# Patient Record
Sex: Male | Born: 1990 | Race: Black or African American | Hispanic: No | Marital: Single | Smoking: Current every day smoker
Health system: Southern US, Community
[De-identification: ages and names within clinical notes are randomized; demographics above are authoritative.]

## PROBLEM LIST (undated history)

## (undated) HISTORY — PX: THUMB ARTHROSCOPY: SHX2509

---
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
## 2011-01-25 LAB — DIFFERENTIAL
Basophils Absolute: 0 10*3/uL (ref 0.0–0.1)
Basophils Relative: 0 % (ref 0–1)
Eosinophils Absolute: 0 10*3/uL (ref 0.0–0.7)
Lymphocytes Relative: 30 % (ref 12–46)
Monocytes Relative: 6 % (ref 3–12)

## 2011-01-25 LAB — COMPREHENSIVE METABOLIC PANEL
ALT: 25 U/L (ref 0–53)
AST: 55 U/L — ABNORMAL HIGH (ref 0–37)
Calcium: 8.9 mg/dL (ref 8.4–10.5)
Chloride: 103 mEq/L (ref 96–112)
Creatinine, Ser: 1.02 mg/dL (ref 0.4–1.5)
GFR calc Af Amer: >60 mL/min (ref >60)
Potassium: 3.8 mEq/L (ref 3.5–5.1)
Sodium: 135 mEq/L (ref 135–145)

## 2011-01-25 LAB — LIPASE, BLOOD: Lipase: 33 U/L (ref 11–59)

## 2011-01-25 LAB — CBC
MCH: 29.3 pg (ref 26.0–34.0)
MCV: 82.7 fL (ref 78.0–100.0)
WBC: 10.7 10*3/uL — ABNORMAL HIGH (ref 4.0–10.5)

## 2011-02-12 ENCOUNTER — Ambulatory Visit (HOSPITAL_COMMUNITY): Payer: Self-pay

## 2011-02-16 LAB — MONONUCLEOSIS SCREEN: Mono Screen: NEGATIVE

## 2014-05-02 ENCOUNTER — Encounter (HOSPITAL_COMMUNITY): Payer: Self-pay | Admitting: Emergency Medicine

## 2014-05-02 ENCOUNTER — Emergency Department (HOSPITAL_COMMUNITY): Payer: Self-pay

## 2014-05-02 ENCOUNTER — Emergency Department (HOSPITAL_COMMUNITY)
Admission: EM | Admit: 2014-05-02 | Discharge: 2014-05-02 | Disposition: A | Discharge status: Home / Self Care | Payer: Self-pay | Attending: Emergency Medicine | Admitting: Emergency Medicine

## 2014-05-02 DIAGNOSIS — L02519 Cutaneous abscess of unspecified hand: Secondary | ICD-10-CM | POA: Insufficient documentation

## 2014-05-02 DIAGNOSIS — L03019 Cellulitis of unspecified finger: Principal | ICD-10-CM

## 2014-05-02 DIAGNOSIS — L02511 Cutaneous abscess of right hand: Secondary | ICD-10-CM

## 2014-05-02 MED ORDER — SULFAMETHOXAZOLE-TRIMETHOPRIM 800-160 MG PO TABS: 1.0000 | ORAL_TABLET | Freq: Two times a day (BID) | ORAL | Status: DC

## 2014-05-02 MED ORDER — OXYCODONE-ACETAMINOPHEN 5-325 MG PO TABS: 1.0000 | ORAL_TABLET | Freq: Four times a day (QID) | ORAL | Status: DC | PRN

## 2014-05-02 MED ORDER — HYDROCODONE-ACETAMINOPHEN 5-325 MG PO TABS
2.0000 | ORAL_TABLET | Freq: Once | ORAL | Status: AC
  Administered 2014-05-02: 2 via ORAL
  Filled 2014-05-02: qty 2

## 2014-05-02 MED ORDER — ONDANSETRON 4 MG PO TBDP
8.0000 mg | ORAL_TABLET | Freq: Once | ORAL | Status: AC
  Administered 2014-05-02: 8 mg via ORAL
  Filled 2014-05-02: qty 2

## 2014-05-02 MED ORDER — CEPHALEXIN 500 MG PO CAPS: ORAL_CAPSULE | ORAL | Status: DC

## 2014-05-02 NOTE — ED Notes (Signed)
Declined W/C at D/C and was escorted to lobby by RN. 

## 2014-05-02 NOTE — ED Provider Notes (Signed)
CSN: [id number]     Arrival date & time 05/02/14  2002 History  This chart was scribed for non-physician practitioner, Junious SilkHannah Merrell PA-C, working with Glynn OctaveStephen Rancour, MD by Milly JakobJohn Lee Graves, ED Scribe. The patient was seen in room TR10C/TR10C. Patient's care was started at 8:28 PM.     Chief Complaint  Patient presents with  . Hand Pain   The history is provided by the patient. No language interpreter was used.   HPI Comments: Matthew [name] is a 23 y.o. male who presents to the Emergency Department complaining of constant, severe, throbbing pain in his right thumb onset 7 days ago. He states that he has been experiencing pain for a few months, and reports biting a callus on this thumb. He states that the pain is exacerbated by bending his thumb.  He reports that he noticed drainage from his thumb after hitting it against his car door. He denies any additional medical problems.   No past medical history on file. No past surgical history on file. No family history on file. History  Substance Use Topics  . Smoking status: Not on file  . Smokeless tobacco: Not on file  . Alcohol Use: Not on file    Review of Systems  Skin: Positive for color change (right thumb).  All other systems reviewed and are negative.     Allergies  Review of patient's allergies indicates not on file.  Home Medications   Prior to Admission medications   Not on File   Triage Vitals: BP 134/62  Pulse 73  Temp(Src) 97 F (36.1 C) (Oral)  Resp 16  SpO2 100% Physical Exam  Nursing note and vitals reviewed. Constitutional: He is oriented to person, place, and time. He appears well-developed and well-nourished. No distress.  HENT:  Head: Normocephalic and atraumatic.  Right Ear: External ear normal.  Left Ear: External ear normal.  Nose: Nose normal.  Eyes: Conjunctivae are normal.  Neck: Normal range of motion. No tracheal deviation present.  Cardiovascular: Normal rate, regular rhythm, normal  heart sounds, intact distal pulses and normal pulses.   Pulses:      Radial pulses are 2+ on the right side.  Capillary refill < 3 seconds in right thumb  Pulmonary/Chest: Effort normal and breath sounds normal. No stridor.  Abdominal: Soft. He exhibits no distension. There is no tenderness.  Musculoskeletal: Normal range of motion.  Patient is able to flex and extend right thumb. ROM mildly limited due to swelling, not pain.  Compartment soft, neurovascularly intact.   Neurological: He is alert and oriented to person, place, and time.  Skin: Skin is warm and dry. He is not diaphoretic.  Swelling to right thumb. 2 cm fluctuant area to palmar aspect of right thumb. No active drainage. No surrounding erythema or streaking.   Psychiatric: He has a normal mood and affect. His behavior is normal.    ED Course  Procedures (including critical care time) DIAGNOSTIC STUDIES: Oxygen Saturation is 100% on room air, normal by my interpretation.    COORDINATION OF CARE: 8:32 PM-Discussed treatment plan which includes thumb x-ray with pt at bedside and pt agreed to plan.   Labs Review Labs Reviewed - No data to display  Imaging Review Dg Finger Thumb Right  05/02/2014   CLINICAL DATA:  HAND PAIN  EXAM: RIGHT THUMB 2+V  COMPARISON:  None.  FINDINGS: There is no evidence of fracture or dislocation. There is no evidence of arthropathy or other focal bone abnormality. Mild soft  tissue swelling seen at the volar aspect of the right first proximal phalanx. No retained foreign body or soft tissue emphysema.  IMPRESSION: 1. No acute fracture or dislocation. 2. Focal soft tissue swelling at the volar aspect of the right first proximal phalanx. No radiopaque foreign body or soft tissue emphysema.   Electronically Signed   By: Rise MuBenjamin  McClintock M.D.   On: 05/02/2014 21:16     EKG Interpretation None      INCISION AND DRAINAGE Performed by: Junious SilkMerrell, Hannah Consent: Verbal consent obtained. Risks and  benefits: risks, benefits and alternatives were discussed Type: abscess  Body area: right thumb  Anesthesia: digital block  Incision was made with a scalpel.  Local anesthetic: lidocaine 2%   Anesthetic total: 3 ml  Complexity: complex Blunt dissection to break up loculations  Drainage: purulent  Drainage amount: copious  Packing material: 1/4 in iodoform gauze  Patient tolerance: Patient tolerated the procedure well with no immediate complications.     MDM   Final diagnoses:  Abscess of finger, right   9:30 PM Discussed with Dr. Mina MarbleWeingold given location of abscess. Will I&D here, pack abscess and start on either Bactrim or doxycyline. Patient can follow up with Dr. Mina MarbleWeingold in the office tomorrow or Tuesday.   Patient with skin abscess amenable to incision and drainage.  Given location of abscess, discussed case with Dr. Mina MarbleWeingold who recommends I&D, packing, wound culture and abx. This was done without complication. Encouraged home warm compresses. Patient will follow up with Dr. Mina MarbleWeingold in the office.  Will d/c to home.    I personally performed the services described in this documentation, which was scribed in my presence. The recorded information has been reviewed and is accurate.    Mora BellmanHannah S Merrell, PA-C 05/03/14 1255

## 2014-05-02 NOTE — ED Notes (Signed)
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 Pt reports he bites the callus and now the thumb is swollen and painful. Pt reports pain to be 10/10. The skin is dry and no drainage is present.

## 2014-05-02 NOTE — Discharge Instructions (Signed)

## 2014-05-03 NOTE — ED Provider Notes (Signed)
Medical screening examination/treatment/procedure(s) were performed by non-physician practitioner and as supervising physician I was immediately available for consultation/collaboration.   EKG Interpretation None       Stephen Rancour, MD 05/03/14 1527 

## 2014-05-05 LAB — WOUND CULTURE

## 2015-01-31 IMAGING — CR DG FINGER THUMB 2+V*R*
3 series · 3 of 3 positions shown · non-contrast
Comparison: None.

CLINICAL DATA: HAND PAIN

EXAM:
RIGHT THUMB 2+V

[x finger pa right]
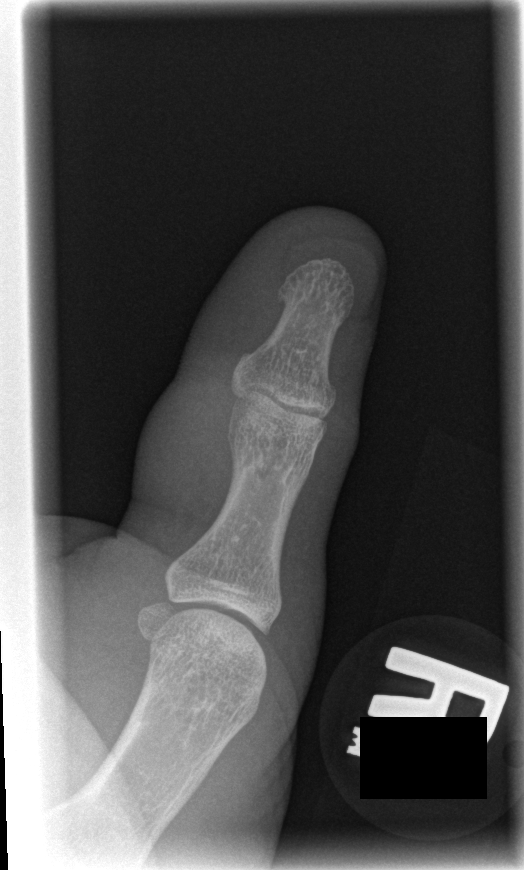

[x finger obl. right]
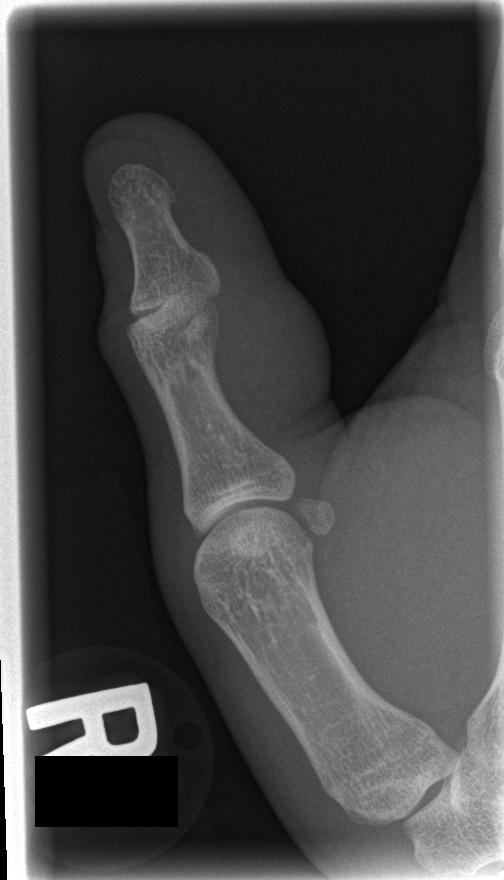

[x finger lateral right]
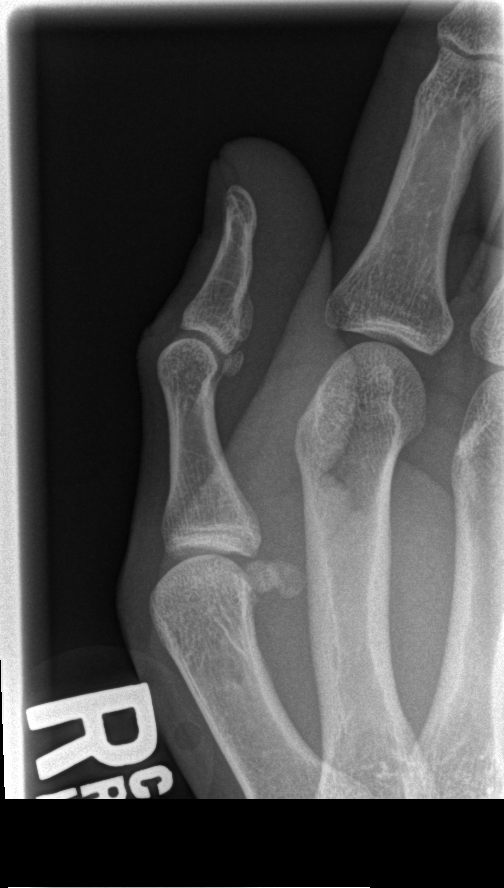

[3 of 3 positions shown; findings below may reference images not displayed]

FINDINGS: There is no evidence of fracture or dislocation. There is no
evidence of arthropathy or other focal bone abnormality. Mild soft
tissue swelling seen at the volar aspect of the right first proximal
phalanx. No retained foreign body or soft tissue emphysema.
IMPRESSION: 1. No acute fracture or dislocation.
2. Focal soft tissue swelling at the volar aspect of the right first
proximal phalanx. No radiopaque foreign body or soft tissue
emphysema.

## 2016-08-05 ENCOUNTER — Emergency Department (HOSPITAL_COMMUNITY)
Admission: EM | Admit: 2016-08-05 | Discharge: 2016-08-05 | Disposition: A | Discharge status: Home / Self Care | Payer: Self-pay | Attending: Emergency Medicine | Admitting: Emergency Medicine

## 2016-08-05 ENCOUNTER — Emergency Department (HOSPITAL_COMMUNITY): Payer: Self-pay

## 2016-08-05 ENCOUNTER — Encounter (HOSPITAL_COMMUNITY): Payer: Self-pay | Admitting: Emergency Medicine

## 2016-08-05 DIAGNOSIS — Y929 Unspecified place or not applicable: Secondary | ICD-10-CM | POA: Insufficient documentation

## 2016-08-05 DIAGNOSIS — W208XXA Other cause of strike by thrown, projected or falling object, initial encounter: Secondary | ICD-10-CM | POA: Insufficient documentation

## 2016-08-05 DIAGNOSIS — Y9389 Activity, other specified: Secondary | ICD-10-CM | POA: Insufficient documentation

## 2016-08-05 DIAGNOSIS — Z79899 Other long term (current) drug therapy: Secondary | ICD-10-CM | POA: Insufficient documentation

## 2016-08-05 DIAGNOSIS — Y999 Unspecified external cause status: Secondary | ICD-10-CM | POA: Insufficient documentation

## 2016-08-05 DIAGNOSIS — M25561 Pain in right knee: Secondary | ICD-10-CM | POA: Insufficient documentation

## 2016-08-05 DIAGNOSIS — F17290 Nicotine dependence, other tobacco product, uncomplicated: Secondary | ICD-10-CM | POA: Insufficient documentation

## 2016-08-05 NOTE — ED Provider Notes (Signed)
[facility]-EMERGENCY DEPT Provider Note   CSN: [id number] Arrival date & time: 08/05/16  1048  By signing my name below, I, [name], attest that this documentation has been prepared under the direction and in the presence of Teressa Lower NP.  Electronically Signed: [name], ED Scribe. 08/05/16. 12:31 PM.   History   Chief Complaint Chief Complaint  Patient presents with  . Knee Injury    HPI HPI Comments: Matthew [name] is a 25 y.o. male who presents to the Emergency Department complaining of right knee pain s/p an injury that occurred yesterday. Pt was sitting down and dropped his drum on his knee. He reports worst pain to medial aspect of the right knee. No Hx of knee injuries or surgeries. Pt is able to ambulate and is wearing a right knee brace currently to help with stability during ambulation.    The history is provided by the patient. No language interpreter was used.    History reviewed. No pertinent past medical history.  There are no active problems to display for this patient.   History reviewed. No pertinent surgical history.     Home Medications    Prior to Admission medications   Medication Sig Start Date End Date Taking? Authorizing Provider  cephALEXin (KEFLEX) 500 MG capsule 2 caps po bid x 7 days 05/02/14   Junious Silk, PA-C  naphazoline-glycerin (CLEAR EYES) 0.012-0.2 % SOLN Place 1-2 drops into both eyes every 4 (four) hours as needed for irritation.    Historical Provider, MD  oxyCODONE-acetaminophen (PERCOCET/ROXICET) 5-325 MG per tablet Take 1 tablet by mouth every 6 (six) hours as needed for severe pain. May take 2 tablets PO q 6 hours for severe pain - Do not take with Tylenol as this tablet already contains tylenol 05/02/14   Junious Silk, PA-C  sulfamethoxazole-trimethoprim (SEPTRA DS) 800-160 MG per tablet Take 1 tablet by mouth every 12 (twelve) hours. 05/02/14   Junious Silk, PA-C    Family History No family history on  file.  Social History Social History  Substance Use Topics  . Smoking status: Current Every Day Smoker    Types: Cigars  . Smokeless tobacco: Never Used  . Alcohol use No     Allergies   Review of patient's allergies indicates no known allergies.   Review of Systems Review of Systems  Musculoskeletal: Positive for arthralgias (right knee).  Neurological: Negative for numbness.  All other systems reviewed and are negative.    Physical Exam Updated Vital Signs BP 153/85   Pulse 73   Temp 98 F (36.7 C)   SpO2 100%   Physical Exam  Constitutional: He is oriented to person, place, and time. He appears well-developed and well-nourished. No distress.  HENT:  Head: Normocephalic and atraumatic.  Neck: Normal range of motion.  Cardiovascular: Normal rate.   Pulmonary/Chest: Effort normal and breath sounds normal.  Musculoskeletal: Normal range of motion.  No deformity or swelling noted. Pulses intact. Full rom  Neurological: He is alert and oriented to person, place, and time.  Skin: Skin is warm and dry. Capillary refill takes less than 2 seconds. He is not diaphoretic.  Psychiatric: He has a normal mood and affect. Judgment normal.  Nursing note and vitals reviewed.    ED Treatments / Results  DIAGNOSTIC STUDIES: Oxygen Saturation is 100% on RA, normal by my interpretation.  COORDINATION OF CARE: 12:30 PM-Will wait for imaging results. Discussed treatment plan with pt at bedside and pt agreed to plan.  Labs (all labs ordered are listed, but only abnormal results are displayed) Labs Reviewed - No data to display  EKG  EKG Interpretation None       Radiology Dg Knee Complete 4 Views Right  Result Date: 08/05/2016 CLINICAL DATA:  Right knee pain after injury yesterday. EXAM: RIGHT KNEE - COMPLETE 4+ VIEW COMPARISON:  None. FINDINGS: No evidence of fracture, dislocation, or joint effusion. No evidence of arthropathy or other focal bone abnormality. Soft  tissues are unremarkable. IMPRESSION: Normal right knee. Electronically Signed   By: Lupita RaiderJames  Green Jr, M.D.   On: 08/05/2016 12:28    Procedures Procedures (including critical care time)  Medications Ordered in ED Medications - No data to display   Initial Impression / Assessment and Plan / ED Course  I have reviewed the triage vital signs and the nursing notes.  Pertinent labs & imaging results that were available during my care of the patient were reviewed by me and considered in my medical decision making (see chart for details).  Clinical Course    No acute bony injury. Discussed supportive care with pt  Final Clinical Impressions(s) / ED Diagnoses   Final diagnoses:  None    New Prescriptions New Prescriptions   No medications on file  I personally performed the services described in this documentation, which was scribed in my presence. The recorded information has been reviewed and is accurate.     Teressa LowerVrinda Kashon Kraynak, NP 08/05/16 1248    Mancel BaleElliott Wentz, MD 08/05/16 [phone]

## 2016-08-05 NOTE — ED Notes (Signed)
Bed: WTR9 Expected date:  Expected time:  Means of arrival:  Comments: 

## 2016-08-05 NOTE — ED Triage Notes (Signed)
Patient states that yesterday he was packing up his drums and dropped one on his right knee and having pain. Patient is wearing a brace on right knee to help with stability.

## 2017-09-09 ENCOUNTER — Ambulatory Visit: Payer: Self-pay | Admitting: Podiatry

## 2017-09-24 ENCOUNTER — Ambulatory Visit: Payer: Self-pay | Admitting: Podiatry

## 2017-11-17 ENCOUNTER — Encounter: Payer: Self-pay | Admitting: Podiatry

## 2017-11-24 NOTE — Progress Notes (Signed)
This encounter was created in error - please disregard.

## 2017-11-26 ENCOUNTER — Encounter: Payer: Self-pay | Admitting: Family Medicine

## 2017-11-26 ENCOUNTER — Ambulatory Visit (INDEPENDENT_AMBULATORY_CARE_PROVIDER_SITE_OTHER): Payer: No Typology Code available for payment source | Admitting: Family Medicine

## 2017-11-26 VITALS — BP 124/82 | HR 86 | Temp 99.2°F | Resp 18 | Ht 70.0 in | Wt 190.0 lb

## 2017-11-26 DIAGNOSIS — B353 Tinea pedis: Secondary | ICD-10-CM

## 2017-11-26 DIAGNOSIS — R61 Generalized hyperhidrosis: Secondary | ICD-10-CM

## 2017-11-26 DIAGNOSIS — Z23 Encounter for immunization: Secondary | ICD-10-CM | POA: Diagnosis not present

## 2017-11-26 DIAGNOSIS — B360 Pityriasis versicolor: Secondary | ICD-10-CM | POA: Diagnosis not present

## 2017-11-26 DIAGNOSIS — B351 Tinea unguium: Secondary | ICD-10-CM

## 2017-11-26 MED ORDER — KETOCONAZOLE 2 % EX SHAM: 1.0000 "application " | MEDICATED_SHAMPOO | CUTANEOUS | 0 refills | Status: DC

## 2017-11-26 NOTE — Patient Instructions (Addendum)
IF you received an x-ray today, you will receive an invoice from [facility]. Please contact [facility] at [phone] with questions or concerns regarding your invoice.   IF you received labwork today, you will receive an invoice from [facility]. Please contact [facility] at [phone] with questions or concerns regarding your invoice.   Our billing staff will not be able to assist you with questions regarding bills from these companies.  You will be contacted with the lab results as soon as they are available. The fastest way to get your results is to activate your My Chart account. Instructions are located on the last page of this paperwork. If you have not heard from [place] regarding the results in 2 weeks, please contact this office.    Hyperhidrosis It is normal to sweat when you are hot, being physically active, or feeling anxious. Sweating is a necessary function for your body. However, hyperhidrosis is when you sweat too much (excessively). Although hyperhidrosis is not dangerous, it can make you feel embarrassed. There are two kinds of hyperhidrosis:  Primary hyperhidrosis. The sweating usually localizes in one part of your body, such as your underarms, or in a few areas, such as your feet, face, armpits, and hands. This is the more common kind of hyperhidrosis.  Secondary hyperhidrosis. This type more likely affects your entire body.  What are the causes? The cause of your hyperhidrosis depends on the kind you have.  Primary hyperhidrosis may be caused by having sweat glands that are more active than normal.  Secondary hyperhidrosis is caused by an underlying condition. Possible conditions include: ? Diabetes. ? Gout. ? Certain medicines. ? Anxiety. ? Stroke. ? Obesity. ? Menopause. ? Overactive thyroid (hyperthyroidism). ? Tumors. ? Frostbite. ? Certain types of cancers. ? Alcoholism. ? Injury to your nervous system. ? Stroke. ? Parkinson  disease.  What increases the risk? You may be at an increased risk for primary hyperhidrosis if you have a family history of it. What are the signs or symptoms? General symptoms of hyperhidrosis may include:  Feeling like you are sweating constantly, even while you are resting.  Having skin that peels or gets paler or softer in the areas where you sweat the most.  Being able to see sweat on your skin.  Symptoms of primary hyperhidrosis may include:  Sweating in specific areas, such as your armpits, palms, feet, and face.  Sweating in the same location on both sides of your body.  Sweating only during the day.  Symptoms of secondary hyperhidrosis may include:  Sweating all over your body.  Sweating even while you sleep.  How is this diagnosed? Hyperhidrosis may be diagnosed by:  Medical history and physical exam.  Testing, such as: ? Sweat test. ? Paper test.  How is this treated? Your treatment will depend on the kind of hyperhidrosis you have and the parts of your body that are affected. If your hyperhidrosis is caused by an underlying condition, your treatment will address the cause. Treatment may include:  Strong antiperspirants. Your health care provider may give you a prescription.  Medicines taken by mouth.  Medicines injected by your health care provider. These may include small amounts of botulinum toxin.  Iontophoresis. This is a procedure that temporarily turns off the sweat glands in your hands and feet.  Surgery to remove your sweat glands.  Sympathectomy. This is a procedure that cuts or destroys your nerves so that they do not send a signal to sweat.  Follow these instructions at home:  Take medicines only as directed by your health care provider.  Use antiperspirants as directed by your health care provider.  Limit or avoid foods or beverages that seem to increase your chances of sweating, such as: ? Spicy  food. ? Caffeine. ? Alcohol. ? Foods that contain MSG.  If your feet sweat: ? Wear sandals, when possible. ? Do not wear cotton socks. Wear socks that remove or wick moisture from your feet. ? Wear leather shoes. ? Avoid wearing the same pair of shoes two days in a row.  Consider joining a hyperhidrosis support group. Contact a health care provider if:  You have new symptoms.  Your symptoms get worse. This information is not intended to replace advice given to you by your health care provider. Make sure you discuss any questions you have with your health care provider. Document Released: 12/27/2005 Document Revised: 04/04/2016 Document Reviewed: 06/07/2014 Elsevier Interactive Patient Education  2018 [employer] Tinea Versicolor Tinea versicolor is a common fungal infection of the skin. It causes a rash that appears as light or dark patches on the skin. The rash most often occurs on the chest, back, neck, or upper arms. This condition is more common during warm weather. Other than affecting how your skin looks, tinea versicolor usually does not cause other problems. In most cases, the infection goes away in a few weeks with treatment. It may take a few months for the patches on your skin to clear up. What are the causes? Tinea versicolor occurs when a type of fungus that is normally present on the skin starts to overgrow. This fungus is a kind of yeast. The exact cause of the overgrowth is not known. This condition cannot be passed from one person to another (noncontagious). What increases the risk? This condition is more likely to develop when certain factors are present, such as:  Heat and humidity.  Sweating too much.  Hormone changes.  Oily skin.  A weak defense (immune) system.  What are the signs or symptoms? Symptoms of this condition may include:  A rash on your skin that is made up of light or dark patches. The rash may have: ? Patches of tan or pink spots on  light skin. ? Patches of white or brown spots on dark skin. ? Patches of skin that do not tan. ? Well-marked edges. ? Scales on the discolored areas.  Mild itching.  How is this diagnosed? A health care provider can usually diagnose this condition by looking at your skin. During the exam, he or she may use ultraviolet light to help determine the extent of the infection. In some cases, a skin sample may be taken by scraping the rash. This sample will be viewed under a microscope to check for yeast overgrowth. How is this treated? Treatment for this condition may include:  Dandruff shampoo that is applied to the affected skin during showers or bathing.  Over-the-counter medicated skin cream, lotion, or soaps.  Prescription antifungal medicine in the form of skin cream or pills.  Medicine to help reduce itching.  Follow these instructions at home:  Take medicines only as directed by your health care provider.  Apply dandruff shampoo to the affected area if told to do so by your health care provider. You may be instructed to scrub the affected skin for several minutes each day.  Do not scratch the affected area of skin.  Avoid hot and humid conditions.  Do not use tanning  booths.  Try to avoid sweating a lot. Contact a health care provider if:  Your symptoms get worse.  You have a fever.  You have redness, swelling, or pain at the site of your rash.  You have fluid, blood, or pus coming from your rash.  Your rash returns after treatment. This information is not intended to replace advice given to you by your health care provider. Make sure you discuss any questions you have with your health care provider. Document Released: 10/25/2000 Document Revised: 06/30/2016 Document Reviewed: 08/09/2014 Elsevier Interactive Patient Education  2018 [employer].

## 2017-11-26 NOTE — Progress Notes (Signed)
Chief Complaint  Patient presents with  . Establish Care    Skin discoloration, Patient believes from drinking soda. Burning sensation on feet    HPI   Pt reports that he has been having burning sensation of the lateral aspect of the feet bilaterally He reports that he has to wear crocs He reports that he sweats on his feet  Noticed a rash on his forehead and torso Sometimes itchy Worse in the winter than summer No fevers or chills     No past medical history on file.  Current Outpatient Medications  Medication Sig Dispense Refill  . [START ON 11/27/2017] ketoconazole (NIZORAL) 2 % shampoo Apply 1 application topically 2 (two) times a week. 120 mL 0  . oxyCODONE-acetaminophen (PERCOCET/ROXICET) 5-325 MG per tablet Take 1 tablet by mouth every 6 (six) hours as needed for severe pain. May take 2 tablets PO q 6 hours for severe pain - Do not take with Tylenol as this tablet already contains tylenol (Patient not taking: Reported on 11/26/2017) 3 tablet 0   No current facility-administered medications for this visit.     Allergies: No Known Allergies  No past surgical history on file.  Social History   Socioeconomic History  . Marital status: Single    Spouse name: None  . Number of children: None  . Years of education: None  . Highest education level: None  Social Needs  . Financial resource strain: None  . Food insecurity - worry: None  . Food insecurity - inability: None  . Transportation needs - medical: None  . Transportation needs - non-medical: None  Occupational History  . None  Tobacco Use  . Smoking status: Current Every Day Smoker    Types: Cigars  . Smokeless tobacco: Never Used  Substance and Sexual Activity  . Alcohol use: No  . Drug use: None  . Sexual activity: None  Other Topics Concern  . None  Social History Narrative  . None    No family history on file.   ROS Review of Systems See HPI Constitution: No fevers or chills No  malaise No diaphoresis Skin: No rash or itching Eyes: no blurry vision, no double vision GU: no dysuria or hematuria Neuro: no dizziness or headaches all others reviewed and negative   Objective: Vitals:   11/26/17 1325  BP: 124/82  Pulse: 86  Resp: 18  Temp: 99.2 F (37.3 C)  TempSrc: Oral  SpO2: 98%  Weight: 190 lb (86.2 kg)  Height: 5\' 10"  (1.778 m)    Physical Exam  Constitutional: He is oriented to person, place, and time. He appears well-developed and well-nourished.  HENT:  Head: Normocephalic and atraumatic.  Eyes: Conjunctivae and EOM are normal.  Cardiovascular: Normal rate, regular rhythm and normal heart sounds.  No murmur heard. Pulses:      Dorsalis pedis pulses are 2+ on the right side, and 2+ on the left side.       Posterior tibial pulses are 2+ on the right side, and 2+ on the left side.  Pulmonary/Chest: Effort normal and breath sounds normal. No stridor. No respiratory distress.  Feet:  Right Foot:  Protective Sensation: 4 sites tested. 4 sites sensed.  Skin Integrity: Positive for callus. Negative for ulcer or blister.  Left Foot:  Protective Sensation: 4 sites tested. 4 sites sensed.  Skin Integrity: Positive for callus. Negative for ulcer or blister.  Neurological: He is alert and oriented to person, place, and time.  Skin:  Torso and  forehead with tan patch of skin that are well demarcated, non scaly, flat, no purulence, no erythema  Feet: Thickened nails on both feet, grey skin on the heel and in between toes, noted to be the worst between 4th and 5th toes bilaterally     Assessment and Plan Carla DrapeMarkel was seen today for establish care.  Diagnoses and all orders for this visit:  Pityriasis versicolor- advise - advised shampoo and topical treatment -     ketoconazole (NIZORAL) 2 % shampoo; Apply 1 application topically 2 (two) times a week.  Needs flu shot -     Flu Vaccine QUAD 36+ mos IM  Onychomycosis- would recommend lamisil Will  check liver function   -     Ambulatory referral to Podiatry  Tinea pedis of both feet- referral for debridement and treatment and to establish care -     Ambulatory referral to Podiatry  Hyperhidrosis- discussed home care of feet   Other orders -     Tdap vaccine greater than or equal to 7yo IM     Sylvain Hasten A [employer]

## 2017-11-27 LAB — COMPREHENSIVE METABOLIC PANEL
ALT: 14 IU/L (ref 0–44)
AST: 21 IU/L (ref 0–40)
Albumin/Globulin Ratio: 1.8 (ref 1.2–2.2)
Albumin: 5 g/dL (ref 3.5–5.5)
Alkaline Phosphatase: 52 IU/L (ref 39–117)
BUN/Creatinine Ratio: 7 — ABNORMAL LOW (ref 9–20)
BUN: 9 mg/dL (ref 6–20)
Bilirubin Total: 0.2 mg/dL (ref 0.0–1.2)
CALCIUM: 9.7 mg/dL (ref 8.7–10.2)
CHLORIDE: 104 mmol/L (ref 96–106)
CO2: 21 mmol/L (ref 20–29)
Creatinine, Ser: 1.22 mg/dL (ref 0.76–1.27)
GFR, EST AFRICAN AMERICAN: 95 mL/min/{1.73_m2} (ref >59)
GFR, EST NON AFRICAN AMERICAN: 82 mL/min/{1.73_m2} (ref >59)
GLUCOSE: 72 mg/dL (ref 65–99)
Globulin, Total: 2.8 g/dL (ref 1.5–4.5)
Potassium: 4.3 mmol/L (ref 3.5–5.2)
Sodium: 142 mmol/L (ref 134–144)
TOTAL PROTEIN: 7.8 g/dL (ref 6.0–8.5)

## 2017-11-29 ENCOUNTER — Other Ambulatory Visit: Payer: Self-pay | Admitting: Family Medicine

## 2017-11-29 DIAGNOSIS — B351 Tinea unguium: Secondary | ICD-10-CM

## 2017-11-29 DIAGNOSIS — Z5181 Encounter for therapeutic drug level monitoring: Secondary | ICD-10-CM

## 2017-11-29 MED ORDER — TERBINAFINE HCL 250 MG PO TABS: 250.0000 mg | ORAL_TABLET | Freq: Every day | ORAL | 6 refills | Status: DC

## 2017-11-29 NOTE — Progress Notes (Signed)
Normal liver function thus lamisil called in

## 2017-12-01 ENCOUNTER — Telehealth: Payer: Self-pay | Admitting: Family Medicine

## 2017-12-01 DIAGNOSIS — B360 Pityriasis versicolor: Secondary | ICD-10-CM

## 2017-12-01 DIAGNOSIS — B351 Tinea unguium: Secondary | ICD-10-CM

## 2017-12-01 NOTE — Telephone Encounter (Signed)
Medication:  ketoconazole (NIZORAL) 2 % shampoo  terbinafine (LAMISIL) 250 MG tablet  Preferred Pharmacy (with phone number or street name):  [facility] [place] - [address] [place] [address] Phone: [phone] Fax: [phone]  Patient gave the wrong pharmacy when he was in on 11/26/17, [facility]. [facility] a new [facility] prescription to be sent.

## 2017-12-01 NOTE — Telephone Encounter (Signed)
Copied from CRM #[id number]. Topic: Quick Communication - Rx Refill/Question >> Dec 01, 2017  1:18 PM Viviann SpareWhite, Selina wrote: Medication:  ketoconazole (NIZORAL) 2 % shampoo  terbinafine (LAMISIL) 250 MG tablet  Has the patient contacted their pharmacy? Yes.     (Agent: If no, request that the patient contact the pharmacy for the refill.)  Preferred Pharmacy (with phone number or street name):  [facility] [place], [place] - [address] [place] [address] Phone: [phone] Fax: [phone]   Agent: Please be advised that RX refills may take up to 3 business days. We ask that you follow-up with your pharmacy.  Patient gave the wrong pharmacy when he was in on 11/26/17, He would like for the above RXs to [facility] to [facility]

## 2017-12-02 MED ORDER — TERBINAFINE HCL 250 MG PO TABS: 250.0000 mg | ORAL_TABLET | Freq: Every day | ORAL | 6 refills | Status: DC

## 2017-12-02 MED ORDER — KETOCONAZOLE 2 % EX SHAM: 1.0000 "application " | MEDICATED_SHAMPOO | CUTANEOUS | 2 refills | Status: DC

## 2017-12-02 MED FILL — TERBINAFINE HCL 250 MG TAB: 250 | 30 days supply | Qty: 30 | Fill #0

## 2017-12-02 MED FILL — KETOCONAZOLE 2% SHAMPOO: 2 | 20 days supply | Qty: 120 | Fill #0

## 2017-12-02 NOTE — Telephone Encounter (Signed)
Medication sent to [facility]. Please notify the patient.

## 2017-12-03 NOTE — Telephone Encounter (Signed)
LVM advising pt that rx was sent

## 2019-12-13 ENCOUNTER — Ambulatory Visit: Payer: No Typology Code available for payment source | Attending: Internal Medicine

## 2019-12-13 DIAGNOSIS — Z20822 Contact with and (suspected) exposure to covid-19: Secondary | ICD-10-CM

## 2019-12-14 LAB — NOVEL CORONAVIRUS, NAA: SARS-CoV-2, NAA: NOT DETECTED

## 2020-09-15 ENCOUNTER — Encounter (HOSPITAL_COMMUNITY): Payer: Self-pay | Admitting: Emergency Medicine

## 2020-09-15 ENCOUNTER — Ambulatory Visit (HOSPITAL_COMMUNITY)
Admission: EM | Admit: 2020-09-15 | Discharge: 2020-09-15 | Disposition: A | Discharge status: Home / Self Care | Payer: Self-pay | Attending: Family Medicine | Admitting: Family Medicine

## 2020-09-15 ENCOUNTER — Other Ambulatory Visit: Payer: Self-pay

## 2020-09-15 DIAGNOSIS — M25561 Pain in right knee: Secondary | ICD-10-CM

## 2020-09-15 MED ORDER — CYCLOBENZAPRINE HCL 10 MG PO TABS: 10.0000 mg | ORAL_TABLET | Freq: Three times a day (TID) | ORAL | 0 refills | Status: DC | PRN

## 2020-09-15 MED ORDER — NAPROXEN 500 MG PO TABS: 500.0000 mg | ORAL_TABLET | Freq: Two times a day (BID) | ORAL | 0 refills | Status: DC | PRN

## 2020-09-15 NOTE — ED Triage Notes (Signed)
Pt c/ao right knee pain. He states that he had a previous injury in that knee when he played football in high school. He states on Tuesday night he was lifting some boxes and felt a pop. He states the pain kept him from sleeping and it is a throbbing pain.

## 2020-09-16 NOTE — ED Provider Notes (Signed)
[facility]    CSN: [id number] Arrival date & time: 09/15/20  1307      History   Chief Complaint Chief Complaint  Patient presents with  . Knee Pain    HPI Matthew [name] is a 29 y.o. male.   Presenting today with right knee pain for the past few days after lifting some boxes and feeling a pop in the anterior lateral portion of the knee. Has had some swelling, no discoloration, numbness or tingling, leg weakness. States it throbs the worst at night when he's trying to sleep. Has been using knee sleeve, ice, ibuprofen and had a leftover muscle relaxer he took last night. This has been an intermittent issue that flares every now and again since an old football injury from high school.      History reviewed. No pertinent past medical history.  There are no problems to display for this patient.   Past Surgical History:  Procedure Laterality Date  . THUMB ARTHROSCOPY         Home Medications    Prior to Admission medications   Medication Sig Start Date End Date Taking? Authorizing Provider  cyclobenzaprine (FLEXERIL) 10 MG tablet Take 1 tablet (10 mg total) by mouth 3 (three) times daily as needed for muscle spasms. DO NOT DRINK ALCOHOL OR DRIVE WHILE TAKING THIS MEDICATION 09/15/20   Particia Nearing, PA-C  ketoconazole (NIZORAL) 2 % shampoo Apply 1 application topically 2 (two) times a week. 12/04/17   Doristine Bosworth, MD  naproxen (NAPROSYN) 500 MG tablet Take 1 tablet (500 mg total) by mouth 2 (two) times daily as needed. 09/15/20   Particia Nearing, PA-C  terbinafine (LAMISIL) 250 MG tablet Take 1 tablet (250 mg total) by mouth daily. 12/02/17   Doristine Bosworth, MD    Family History Family History  Problem Relation Age of Onset  . Healthy Mother   . Healthy Father     Social History Social History   Tobacco Use  . Smoking status: Current Every Day Smoker    Packs/day: 0.10    Types: Cigars  . Smokeless tobacco: Never Used  Vaping Use    . Vaping Use: Never used  Substance Use Topics  . Alcohol use: Yes    Comment: on weekends   . Drug use: Not on file     Allergies   Patient has no known allergies.   Review of Systems Review of Systems PER HPI   Physical Exam Triage Vital Signs ED Triage Vitals  Enc Vitals Group     BP 09/15/20 1506 (!) 165/119     Pulse Rate 09/15/20 1506 75     Resp 09/15/20 1506 19     Temp 09/15/20 1506 98.3 F (36.8 C)     Temp src --      SpO2 09/15/20 1506 98 %     Weight --      Height --      Head Circumference --      Peak Flow --      Pain Score 09/15/20 1502 8     Pain Loc --      Pain Edu? --      Excl. in GC? --    No data found.  Updated Vital Signs BP (!) 165/119 (BP Location: Left Arm)   Pulse 75   Temp 98.3 F (36.8 C)   Resp 19   SpO2 98%   Visual Acuity Right Eye Distance:   Left  Eye Distance:   Bilateral Distance:    Right Eye Near:   Left Eye Near:    Bilateral Near:     Physical Exam Vitals and nursing note reviewed.  Constitutional:      Appearance: Normal appearance.  HENT:     Head: Atraumatic.  Eyes:     Extraocular Movements: Extraocular movements intact.     Conjunctiva/sclera: Conjunctivae normal.  Cardiovascular:     Rate and Rhythm: Normal rate and regular rhythm.  Pulmonary:     Effort: Pulmonary effort is normal.     Breath sounds: Normal breath sounds.  Musculoskeletal:        General: Swelling (mild localized edema anterior and lateral right knee, mildly ttp) and tenderness present. No deformity. Normal range of motion.     Cervical back: Normal range of motion and neck supple.     Comments: Neg mcmurrays, drawer testing Gait minimally antalgic, able to extend and flex the knee fully actively, passively and against resistance on the right. No discoloration to knee  Skin:    General: Skin is warm and dry.  Neurological:     General: No focal deficit present.     Mental Status: He is oriented to person, place, and  time.  Psychiatric:        Mood and Affect: Mood normal.        Thought Content: Thought content normal.        Judgment: Judgment normal.      UC Treatments / Results  Labs (all labs ordered are listed, but only abnormal results are displayed) Labs Reviewed - No data to display  EKG   Radiology No results found.  Procedures Procedures (including critical care time)  Medications Ordered in UC Medications - No data to display  Initial Impression / Assessment and Plan / UC Course  I have reviewed the triage vital signs and the nursing notes.  Pertinent labs & imaging results that were available during my care of the patient were reviewed by me and considered in my medical decision making (see chart for details).     Suspect soft tissue strain, no evidence of bony injury today so he is agreeable with foregoing x-ray imaging. will tx with naproxen, flexeril, rest. Work note given, continue RICE protocol. F/u with orthopedics if worsening or not resolving.   Final Clinical Impressions(s) / UC Diagnoses   Final diagnoses:  Acute pain of right knee   Discharge Instructions   None    ED Prescriptions    Medication Sig Dispense Auth. Provider   cyclobenzaprine (FLEXERIL) 10 MG tablet Take 1 tablet (10 mg total) by mouth 3 (three) times daily as needed for muscle spasms. DO NOT DRINK ALCOHOL OR DRIVE WHILE TAKING THIS MEDICATION 30 tablet Particia Nearing, PA-C   naproxen (NAPROSYN) 500 MG tablet Take 1 tablet (500 mg total) by mouth 2 (two) times daily as needed. 60 tablet Particia Nearing, [place]     PDMP not reviewed this encounter.   Particia Nearing, [place] 09/16/20 1027

## 2021-01-08 ENCOUNTER — Ambulatory Visit (HOSPITAL_COMMUNITY)
Admission: EM | Admit: 2021-01-08 | Discharge: 2021-01-08 | Disposition: A | Discharge status: Home / Self Care | Payer: Self-pay | Attending: Emergency Medicine | Admitting: Emergency Medicine

## 2021-01-08 ENCOUNTER — Encounter (HOSPITAL_COMMUNITY): Payer: Self-pay | Admitting: Emergency Medicine

## 2021-01-08 ENCOUNTER — Other Ambulatory Visit: Payer: Self-pay

## 2021-01-08 DIAGNOSIS — R21 Rash and other nonspecific skin eruption: Secondary | ICD-10-CM | POA: Diagnosis present

## 2021-01-08 DIAGNOSIS — T7840XA Allergy, unspecified, initial encounter: Secondary | ICD-10-CM | POA: Diagnosis present

## 2021-01-08 MED ORDER — EPINEPHRINE 0.3 MG/0.3ML IJ SOAJ: 0.3000 mg | INTRAMUSCULAR | 1 refills | Status: AC | PRN

## 2021-01-08 MED ORDER — FAMOTIDINE 20 MG PO TABS: 20.0000 mg | ORAL_TABLET | Freq: Two times a day (BID) | ORAL | 0 refills | Status: AC

## 2021-01-08 MED ORDER — PREDNISONE 10 MG (21) PO TBPK: ORAL_TABLET | Freq: Every day | ORAL | 0 refills | Status: DC

## 2021-01-08 NOTE — ED Triage Notes (Signed)
Pt presents with a rash on different areas of body that is very itchy X 2 weeks.

## 2021-01-08 NOTE — Discharge Instructions (Signed)
Rest, avoid heat hot water as it makes rashes worse.  Take medication as directed.  Follow-up with dermatologist of your choice to have some allergy testing completed.  Avoid known triggers such as shrimp nuts peanut butter etc. EpiPen prescribed, only use if you are having symptoms of anaphylaxis-will need to call 9-1-1 when you use it.

## 2021-01-08 NOTE — ED Provider Notes (Signed)
[facility]    CSN: [id number] Arrival date & time: 01/08/21  1442      History   Chief Complaint Chief Complaint  Patient presents with  . Rash    HPI Matthew [name] is a 30 y.o. male.   30 year old African-American male patient presents to ER with complaint of itchy rash.  Patient states he has been eating shrimp and not recently, hot shower made it worse.  Works in [employer].  The history is provided by the patient. No language interpreter was used.    History reviewed. No pertinent past medical history.  Patient Active Problem List   Diagnosis Date Noted  . Rash 01/08/2021  . Allergic reaction 01/08/2021    Past Surgical History:  Procedure Laterality Date  . THUMB ARTHROSCOPY         Home Medications    Prior to Admission medications   Medication Sig Start Date End Date Taking? Authorizing Provider  EPINEPHrine 0.3 mg/0.3 mL IJ SOAJ injection Inject 0.3 mg into the muscle as needed for anaphylaxis. 01/08/21  Yes Jada Kuhnert, Para March, NP  famotidine (PEPCID) 20 MG tablet Take 1 tablet (20 mg total) by mouth 2 (two) times daily. 01/08/21  Yes Mordecai Tindol, Para March, NP  predniSONE (STERAPRED UNI-PAK 21 TAB) 10 MG (21) TBPK tablet Take by mouth daily. Take 6 tabs by mouth daily  for 2 days, then 5 tabs for 2 days, then 4 tabs for 2 days, then 3 tabs for 2 days, 2 tabs for 2 days, then 1 tab by mouth daily for 2 days 01/08/21  Yes Katalina Magri, Para March, NP  cyclobenzaprine (FLEXERIL) 10 MG tablet Take 1 tablet (10 mg total) by mouth 3 (three) times daily as needed for muscle spasms. DO NOT DRINK ALCOHOL OR DRIVE WHILE TAKING THIS MEDICATION 09/15/20   Particia Nearing, PA-C  ketoconazole (NIZORAL) 2 % shampoo Apply 1 application topically 2 (two) times a week. 12/04/17   Doristine Bosworth, MD  naproxen (NAPROSYN) 500 MG tablet Take 1 tablet (500 mg total) by mouth 2 (two) times daily as needed. 09/15/20   Particia Nearing, PA-C  terbinafine (LAMISIL) 250 MG  tablet Take 1 tablet (250 mg total) by mouth daily. 12/02/17   Doristine Bosworth, MD    Family History Family History  Problem Relation Age of Onset  . Healthy Mother   . Healthy Father     Social History Social History   Tobacco Use  . Smoking status: Current Every Day Smoker    Packs/day: 0.10    Types: Cigars  . Smokeless tobacco: Never Used  Vaping Use  . Vaping Use: Never used  Substance Use Topics  . Alcohol use: Yes    Comment: on weekends      Allergies   Patient has no known allergies.   Review of Systems Review of Systems  Skin: Positive for rash.  All other systems reviewed and are negative.    Physical Exam Triage Vital Signs ED Triage Vitals  Enc Vitals Group     BP      Pulse      Resp      Temp      Temp src      SpO2      Weight      Height      Head Circumference      Peak Flow      Pain Score      Pain Loc      Pain Edu?  Excl. in GC?    No data found.  Updated Vital Signs BP (!) 151/101 (BP Location: Left Arm)   Pulse (!) 112   Temp 99.4 F (37.4 C) (Oral)   Resp 20   SpO2 100%   Visual Acuity Right Eye Distance:   Left Eye Distance:   Bilateral Distance:    Right Eye Near:   Left Eye Near:    Bilateral Near:     Physical Exam Vitals and nursing note reviewed.  Constitutional:      Appearance: He is well-developed and well-nourished.  HENT:     Head: Normocephalic and atraumatic.  Eyes:     Conjunctiva/sclera: Conjunctivae normal.  Cardiovascular:     Rate and Rhythm: Normal rate and regular rhythm.     Pulses: Normal pulses.     Heart sounds: No murmur heard.   Pulmonary:     Effort: Pulmonary effort is normal. No respiratory distress.     Breath sounds: Normal breath sounds and air entry.  Abdominal:     Palpations: Abdomen is soft.     Tenderness: There is no abdominal tenderness.  Musculoskeletal:        General: No edema.     Cervical back: Neck supple.  Skin:    General: Skin is warm and dry.      Capillary Refill: Capillary refill takes 2 to 3 seconds.     Findings: Rash present. Rash is macular and papular.  Neurological:     General: No focal deficit present.     Mental Status: He is alert and oriented to person, place, and time.     GCS: GCS eye subscore is 4. GCS verbal subscore is 5. GCS motor subscore is 6.  Psychiatric:        Mood and Affect: Mood and affect and mood normal.        Behavior: Behavior normal. Behavior is cooperative.      UC Treatments / Results  Labs (all labs ordered are listed, but only abnormal results are displayed) Labs Reviewed - No data to display  EKG   Radiology No results found.  Procedures Procedures (including critical care time)  Medications Ordered in UC Medications - No data to display  Initial Impression / Assessment and Plan / UC Course  I have reviewed the triage vital signs and the nursing notes.  Pertinent labs & imaging results that were available during my care of the patient were reviewed by me and considered in my medical decision making (see chart for details).     DDX: Allergic reaction, dermatitis, scabies Final Clinical Impressions(s) / UC Diagnoses   Final diagnoses:  Rash  Allergic reaction, initial encounter     Discharge Instructions     Rest, avoid heat hot water as it makes rashes worse.  Take medication as directed.  Follow-up with dermatologist of your choice to have some allergy testing completed.  Avoid known triggers such as shrimp nuts peanut butter etc. EpiPen prescribed, only use if you are having symptoms of anaphylaxis-will need to call 9-1-1 when you use it.     ED Prescriptions    Medication Sig Dispense Auth. Provider   predniSONE (STERAPRED UNI-PAK 21 TAB) 10 MG (21) TBPK tablet Take by mouth daily. Take 6 tabs by mouth daily  for 2 days, then 5 tabs for 2 days, then 4 tabs for 2 days, then 3 tabs for 2 days, 2 tabs for 2 days, then 1 tab by mouth daily for 2  days 42 tablet  Red Mandt, NP   EPINEPHrine 0.3 mg/0.3 mL IJ SOAJ injection Inject 0.3 mg into the muscle as needed for anaphylaxis. 1 each Devlin Brink, Para March, NP   famotidine (PEPCID) 20 MG tablet Take 1 tablet (20 mg total) by mouth 2 (two) times daily. 30 tablet Appolonia Ackert, Para March, NP     PDMP not reviewed this encounter.   Clancy Gourd, NP 01/08/21 1725

## 2021-02-16 ENCOUNTER — Emergency Department (HOSPITAL_COMMUNITY)
Admission: EM | Admit: 2021-02-16 | Discharge: 2021-02-16 | Disposition: A | Discharge status: Home / Self Care | Payer: BC Managed Care – PPO | Attending: Emergency Medicine | Admitting: Emergency Medicine

## 2021-02-16 ENCOUNTER — Encounter (HOSPITAL_COMMUNITY): Payer: Self-pay

## 2021-02-16 ENCOUNTER — Other Ambulatory Visit: Payer: Self-pay

## 2021-02-16 DIAGNOSIS — R21 Rash and other nonspecific skin eruption: Secondary | ICD-10-CM | POA: Diagnosis present

## 2021-02-16 DIAGNOSIS — L089 Local infection of the skin and subcutaneous tissue, unspecified: Secondary | ICD-10-CM | POA: Diagnosis not present

## 2021-02-16 DIAGNOSIS — F17290 Nicotine dependence, other tobacco product, uncomplicated: Secondary | ICD-10-CM | POA: Insufficient documentation

## 2021-02-16 MED ORDER — PREDNISONE 10 MG (21) PO TBPK: ORAL_TABLET | Freq: Every day | ORAL | 0 refills | Status: DC

## 2021-02-16 MED ORDER — CEPHALEXIN 500 MG PO CAPS: 500.0000 mg | ORAL_CAPSULE | Freq: Two times a day (BID) | ORAL | 0 refills | Status: AC

## 2021-02-16 NOTE — ED Provider Notes (Signed)
[facility] Provider Note   CSN: [id number] Arrival date & time: 02/16/21  1456     History Chief Complaint  Patient presents with  . Allergic Reaction  . drainage from [employer] [name] is a 30 y.o. male who presents to ED with rash throughout his entire body as well as a possible skin infection under his bellybutton.  All the symptoms began about 3 weeks ago.  He has been taking Flexeril and naproxen because he was told that this could help with an allergic reaction?  Denies any lip or tongue swelling, fever, sick contacts with similar symptoms, new soaps, lotions, detergents, food ingestions or environmental exposures.  No trouble breathing.  HPI     History reviewed. No pertinent past medical history.  Patient Active Problem List   Diagnosis Date Noted  . Rash 01/08/2021  . Allergic reaction 01/08/2021    Past Surgical History:  Procedure Laterality Date  . THUMB ARTHROSCOPY         Family History  Problem Relation Age of Onset  . Healthy Mother   . Healthy Father     Social History   Tobacco Use  . Smoking status: Current Every Day Smoker    Packs/day: 0.10    Types: Cigars  . Smokeless tobacco: Never Used  Vaping Use  . Vaping Use: Never used  Substance Use Topics  . Alcohol use: Yes    Comment: on weekends   . Drug use: Yes    Types: Marijuana    Home Medications Prior to Admission medications   Medication Sig Start Date End Date Taking? Authorizing Provider  cephALEXin (KEFLEX) 500 MG capsule Take 1 capsule (500 mg total) by mouth 2 (two) times daily for 7 days. 02/16/21 02/23/21 Yes Merel Santoli, PA-C  predniSONE (STERAPRED UNI-PAK 21 TAB) 10 MG (21) TBPK tablet Take by mouth daily. Take 6 tabs by mouth daily  for 2 days, then 5 tabs for 2 days, then 4 tabs for 2 days, then 3 tabs for 2 days, 2 tabs for 2 days, then 1 tab by mouth daily for 2 days 02/16/21  Yes Vandora Jaskulski, PA-C  cyclobenzaprine (FLEXERIL) 10  MG tablet Take 1 tablet (10 mg total) by mouth 3 (three) times daily as needed for muscle spasms. DO NOT DRINK ALCOHOL OR DRIVE WHILE TAKING THIS MEDICATION 09/15/20   Particia Nearing, PA-C  EPINEPHrine 0.3 mg/0.3 mL IJ SOAJ injection Inject 0.3 mg into the muscle as needed for anaphylaxis. 01/08/21   Defelice, Para March, NP  famotidine (PEPCID) 20 MG tablet Take 1 tablet (20 mg total) by mouth 2 (two) times daily. 01/08/21   Defelice, Para March, NP  ketoconazole (NIZORAL) 2 % shampoo Apply 1 application topically 2 (two) times a week. 12/04/17   Doristine Bosworth, MD  naproxen (NAPROSYN) 500 MG tablet Take 1 tablet (500 mg total) by mouth 2 (two) times daily as needed. 09/15/20   Particia Nearing, PA-C  terbinafine (LAMISIL) 250 MG tablet Take 1 tablet (250 mg total) by mouth daily. 12/02/17   Doristine Bosworth, MD    Allergies    Patient has no known allergies.  Review of Systems   Review of Systems  Constitutional: Negative for chills and fever.  Respiratory: Negative for shortness of breath.   Skin: Positive for rash and wound.    Physical Exam Updated Vital Signs BP (!) 166/84 (BP Location: Right Arm)   Pulse 99   Temp 98.2 F (  36.8 C) (Oral)   Resp 16   Ht 5\' 9"  (1.753 m)   Wt 99.8 kg   SpO2 99%   BMI 32.49 kg/m   Physical Exam Vitals and nursing note reviewed.  Constitutional:      General: He is not in acute distress.    Appearance: He is well-developed. He is not diaphoretic.     Comments: No signs of angioedema or anaphylaxis.  Speaking complete sentences without difficulty.  HENT:     Head: Normocephalic and atraumatic.  Eyes:     General: No scleral icterus.    Conjunctiva/sclera: Conjunctivae normal.  Cardiovascular:     Rate and Rhythm: Normal rate and regular rhythm.     Heart sounds: Normal heart sounds.  Pulmonary:     Effort: Pulmonary effort is normal. No respiratory distress.     Breath sounds: Normal breath sounds.  Musculoskeletal:      Cervical back: Normal range of motion.  Skin:    Findings: Erythema (Below the bellybutton with dried drainage noted) and rash (Faint, papular rash throughout extremities) present.  Neurological:     Mental Status: He is alert.     ED Results / Procedures / Treatments   Labs (all labs ordered are listed, but only abnormal results are displayed) Labs Reviewed - No data to display  EKG None  Radiology No results found.  Procedures Procedures   Medications Ordered in ED Medications - No data to display  ED Course  I have reviewed the triage vital signs and the nursing notes.  Pertinent labs & imaging results that were available during my care of the patient were reviewed by me and considered in my medical decision making (see chart for details).    MDM Rules/Calculators/A&P                          31 year old male presenting to the ED for rash throughout body for the past 3 weeks as well as a possible skin infection below his bellybutton.  He believes that the rash could be due to an allergic reaction.  He believes that he was given Flexeril and naproxen to help with allergic reaction.  However chart review shows that he was given these for joint pains a few months ago.  He states that he is not taking any medication to help with his symptoms other than those.  On exam there is a faint papular rash noted throughout the entire body.  There is an area that appears to be cellulitis below the bellybutton with some drainage.  No gross abscess or fluctuance palpated. Pt has a patent airway without stridor and is handling secretions without difficulty; no angioedema. No blisters, no pustules, no warmth, no draining sinus tracts, no superficial abscesses, no bullous impetigo, no vesicles, no desquamation, no target lesions with dusky purpura or a central bulla. Not tender to touch. No concern for superimposed infection. No concern for SJS, TEN, TSS, tick borne illness, syphilis or other  life-threatening condition.  Will treat with steroids for possible allergic reaction and Keflex to help with possible cellulitis.  Patient is agreeable to the plan.  Return precautions given.   Patient is hemodynamically stable, in NAD, and able to ambulate in the ED. Evaluation does not show pathology that would require ongoing emergent intervention or inpatient treatment. I explained the diagnosis to the patient. Pain has been managed and has no complaints prior to discharge. Patient is comfortable with above plan  and is stable for discharge at this time. All questions were answered prior to disposition. Strict return precautions for returning to the ED were discussed. Encouraged follow up with PCP.   An After Visit Summary was printed and given to the patient.   Portions of this note were generated with Scientist, clinical (histocompatibility and immunogenetics). Dictation errors may occur despite best attempts at proofreading.  Final Clinical Impression(s) / ED Diagnoses Final diagnoses:  Rash and nonspecific skin eruption  Skin infection    Rx / DC Orders ED Discharge Orders         Ordered    predniSONE (STERAPRED UNI-PAK 21 TAB) 10 MG (21) TBPK tablet  Daily        02/16/21 1527    cephALEXin (KEFLEX) 500 MG capsule  2 times daily        02/16/21 1527           Dietrich Pates, PA-C 02/16/21 1527    Gerhard Munch, MD 02/16/21 2336

## 2021-02-16 NOTE — ED Triage Notes (Signed)
Patient c/o rash greater than 3 weeks.  Patient also has a clear/light yellow drainage from his naval x 2 weeks. Area around [employer] is tender.

## 2021-02-16 NOTE — Discharge Instructions (Addendum)
Take the antibiotics and steroids as directed. Follow-up with your primary care provider. Return to the ER if you start to experience fever, drainage from the area, increased swelling or pain at the site of your bellybutton, signs of severe allergic reaction such as lip or tongue swelling.

## 2021-03-05 ENCOUNTER — Emergency Department (HOSPITAL_COMMUNITY)
Admission: EM | Admit: 2021-03-05 | Discharge: 2021-03-05 | Disposition: A | Discharge status: Home / Self Care | Payer: BC Managed Care – PPO | Attending: Emergency Medicine | Admitting: Emergency Medicine

## 2021-03-05 ENCOUNTER — Other Ambulatory Visit: Payer: Self-pay

## 2021-03-05 ENCOUNTER — Encounter (HOSPITAL_COMMUNITY): Payer: Self-pay

## 2021-03-05 DIAGNOSIS — L02216 Cutaneous abscess of umbilicus: Secondary | ICD-10-CM | POA: Diagnosis not present

## 2021-03-05 DIAGNOSIS — F17210 Nicotine dependence, cigarettes, uncomplicated: Secondary | ICD-10-CM | POA: Insufficient documentation

## 2021-03-05 DIAGNOSIS — F1210 Cannabis abuse, uncomplicated: Secondary | ICD-10-CM | POA: Insufficient documentation

## 2021-03-05 DIAGNOSIS — L0291 Cutaneous abscess, unspecified: Secondary | ICD-10-CM

## 2021-03-05 DIAGNOSIS — R109 Unspecified abdominal pain: Secondary | ICD-10-CM | POA: Diagnosis present

## 2021-03-05 MED ORDER — DOXYCYCLINE HYCLATE 100 MG PO CAPS: 100.0000 mg | ORAL_CAPSULE | Freq: Two times a day (BID) | ORAL | 0 refills | Status: AC

## 2021-03-05 NOTE — Discharge Instructions (Signed)
Like we discussed, I am going to prescribe you an antibiotic called doxycycline.  I want you to take this twice a day for the next 10 days.  Do not stop taking this antibiotic early.  Continue to take warm showers and apply warm compresses to the bellybutton.  This will help express any continued discharge from the site.  I want you to keep the area clean by showering regularly but do not dry out the area extensively with hydrogen peroxide or alcohol's.  I would recommend applying a triple antibiotic cream to the bellybutton about 2 times per day.  This will help kill bacteria as well as keep the area moisturized.  If you develop fevers, chills, nausea, vomiting, systemic symptoms, or worsening pain, please return to the emergency department for reevaluation.  It was a pleasure to meet you.

## 2021-03-05 NOTE — ED Triage Notes (Signed)
Patient states pain to naval since x3 weeks. Patient states brown drainage with tenderness upon palpation. Patient states prescribed medication last time in ER and didn't help.

## 2021-03-05 NOTE — ED Provider Notes (Signed)
[facility]-EMERGENCY DEPT Provider Note   CSN: [id number] Arrival date & time: 03/05/21  1219     History Chief Complaint  Patient presents with  . Abdominal Pain    Matthew [name] is a 30 y.o. male.  HPI Patient is a 30 year old male who presents the emergency department with pain and drainage from the bellybutton.  He states his symptoms started about 3 weeks ago.  He was initially evaluated in the emergency department similar complaint as well as a rash.  He was discharged on Keflex as well as prednisone.  He states that his rash resolved but he continued to have irritation and drainage from the bellybutton.  States he has brown/yellow discharge from the site that is very mild.  No fevers, chills, nausea, vomiting, urinary complaints.    History reviewed. No pertinent past medical history.  Patient Active Problem List   Diagnosis Date Noted  . Rash 01/08/2021  . Allergic reaction 01/08/2021    Past Surgical History:  Procedure Laterality Date  . THUMB ARTHROSCOPY         Family History  Problem Relation Age of Onset  . Healthy Mother   . Healthy Father     Social History   Tobacco Use  . Smoking status: Current Every Day Smoker    Packs/day: 0.10    Types: Cigars  . Smokeless tobacco: Never Used  Vaping Use  . Vaping Use: Never used  Substance Use Topics  . Alcohol use: Yes    Comment: on weekends   . Drug use: Yes    Types: Marijuana    Home Medications Prior to Admission medications   Medication Sig Start Date End Date Taking? Authorizing Provider  doxycycline (VIBRAMYCIN) 100 MG capsule Take 1 capsule (100 mg total) by mouth 2 (two) times daily for 10 days. 03/05/21 03/15/21 Yes Placido Sou, PA-C  cyclobenzaprine (FLEXERIL) 10 MG tablet Take 1 tablet (10 mg total) by mouth 3 (three) times daily as needed for muscle spasms. DO NOT DRINK ALCOHOL OR DRIVE WHILE TAKING THIS MEDICATION 09/15/20   Particia Nearing, PA-C   EPINEPHrine 0.3 mg/0.3 mL IJ SOAJ injection Inject 0.3 mg into the muscle as needed for anaphylaxis. 01/08/21   Defelice, Para March, NP  famotidine (PEPCID) 20 MG tablet Take 1 tablet (20 mg total) by mouth 2 (two) times daily. 01/08/21   Defelice, Para March, NP  ketoconazole (NIZORAL) 2 % shampoo Apply 1 application topically 2 (two) times a week. 12/04/17   Doristine Bosworth, MD  naproxen (NAPROSYN) 500 MG tablet Take 1 tablet (500 mg total) by mouth 2 (two) times daily as needed. 09/15/20   Particia Nearing, PA-C  predniSONE (STERAPRED UNI-PAK 21 TAB) 10 MG (21) TBPK tablet Take by mouth daily. Take 6 tabs by mouth daily  for 2 days, then 5 tabs for 2 days, then 4 tabs for 2 days, then 3 tabs for 2 days, 2 tabs for 2 days, then 1 tab by mouth daily for 2 days 02/16/21   Idelle Leech, Hina, PA-C  terbinafine (LAMISIL) 250 MG tablet Take 1 tablet (250 mg total) by mouth daily. 12/02/17   Doristine Bosworth, MD    Allergies    Patient has no known allergies.  Review of Systems   Review of Systems  Constitutional: Negative for chills and fever.  Gastrointestinal: Negative for nausea and vomiting.  Genitourinary: Negative for dysuria and penile discharge.  Skin: Positive for color change and wound.    Physical Exam  Updated Vital Signs BP (!) 172/113 (BP Location: Right Arm)   Pulse (!) 104   Temp 98.4 F (36.9 C) (Oral)   Resp 16   Ht 5\' 9"  (1.753 m)   Wt 103.4 kg   SpO2 100%   BMI 33.67 kg/m   Physical Exam Vitals and nursing note reviewed.  Constitutional:      General: He is not in acute distress.    Appearance: He is well-developed.  HENT:     Head: Normocephalic and atraumatic.     Right Ear: External ear normal.     Left Ear: External ear normal.  Eyes:     General: No scleral icterus.       Right eye: No discharge.        Left eye: No discharge.     Conjunctiva/sclera: Conjunctivae normal.  Neck:     Trachea: No tracheal deviation.  Cardiovascular:     Rate and Rhythm:  Normal rate.  Pulmonary:     Effort: Pulmonary effort is normal. No respiratory distress.     Breath sounds: No stridor.  Abdominal:     General: There is no distension.     Tenderness: There is abdominal tenderness in the periumbilical area.     Comments: Protuberant abdomen that is soft.  Navel appears to dry with a small 1 cm crack noted in the center of the navel.  There is a small amount of yellow/brown discharge at the surface.  Unable to express any significant discharge from the site with palpation of the abdomen.  Patient reports moderate tenderness along the navel.  No other overlying skin changes.  No signs of surrounding cellulitis.  Musculoskeletal:        General: No swelling or deformity.     Cervical back: Neck supple.  Skin:    General: Skin is warm and dry.     Findings: No rash.  Neurological:     Mental Status: He is alert.     Cranial Nerves: Cranial nerve deficit: no gross deficits.     ED Results / Procedures / Treatments   Labs (all labs ordered are listed, but only abnormal results are displayed) Labs Reviewed - No data to display  EKG None  Radiology No results found.  Procedures Procedures   Medications Ordered in ED Medications - No data to display  ED Course  I have reviewed the triage vital signs and the nursing notes.  Pertinent labs & imaging results that were available during my care of the patient were reviewed by me and considered in my medical decision making (see chart for details).    MDM Rules/Calculators/A&P                          Patient is a 30 year old male who presents the emergency department with what appears to be an abscess along the navel.  Patient denies any systemic symptoms.  No nausea, vomiting, fevers, chills, or urinary complaints.  He reports moderate pain in the region as well as a small amount of brown/yellow discharge which I also saw on my physical exam.  He was given a course of Keflex earlier in the  month which he states, along with prednisone, alleviated his rash but his bellybutton pain and drainage persisted.  Will discharge on a course of doxycycline.  Also recommend triple antibiotic for the site 1-2 times per day.  Urged patient to continue to monitor his symptoms and return to  the emergency department if he should develop worsening symptoms.  We discussed symptoms associated with systemic infection.  Feel that he is stable for discharge at this time and he is agreeable.  His questions were answered and he was amicable at the time of discharge.  Final Clinical Impression(s) / ED Diagnoses Final diagnoses:  Abscess    Rx / DC Orders ED Discharge Orders         Ordered    doxycycline (VIBRAMYCIN) 100 MG capsule  2 times daily        03/05/21 1252           Placido Sou, PA-C 03/05/21 1257    Arby Barrette, MD 03/13/21 1347

## 2024-06-15 ENCOUNTER — Emergency Department (HOSPITAL_BASED_OUTPATIENT_CLINIC_OR_DEPARTMENT_OTHER)
Admission: EM | Admit: 2024-06-15 | Discharge: 2024-06-16 | Disposition: A | Discharge status: Home / Self Care | Attending: Emergency Medicine | Admitting: Emergency Medicine

## 2024-06-15 ENCOUNTER — Emergency Department (HOSPITAL_BASED_OUTPATIENT_CLINIC_OR_DEPARTMENT_OTHER)

## 2024-06-15 ENCOUNTER — Other Ambulatory Visit: Payer: Self-pay

## 2024-06-15 ENCOUNTER — Encounter (HOSPITAL_BASED_OUTPATIENT_CLINIC_OR_DEPARTMENT_OTHER): Payer: Self-pay

## 2024-06-15 DIAGNOSIS — S93402A Sprain of unspecified ligament of left ankle, initial encounter: Secondary | ICD-10-CM | POA: Diagnosis not present

## 2024-06-15 DIAGNOSIS — X501XXA Overexertion from prolonged static or awkward postures, initial encounter: Secondary | ICD-10-CM | POA: Insufficient documentation

## 2024-06-15 DIAGNOSIS — M25572 Pain in left ankle and joints of left foot: Secondary | ICD-10-CM | POA: Diagnosis present

## 2024-06-15 NOTE — ED Triage Notes (Signed)
 Pt POV after missing a step while walking down stairs, reporting L foot/ankle pain.

## 2024-06-16 MED ORDER — NAPROXEN 500 MG PO TABS: 500.0000 mg | ORAL_TABLET | Freq: Two times a day (BID) | ORAL | 0 refills | Status: AC | PRN

## 2024-06-16 NOTE — ED Provider Notes (Incomplete)
   [place] EMERGENCY DEPARTMENT AT [facility]  Provider Note  CSN: [id number] Arrival date & time: 06/15/24 2300  History Chief Complaint  Patient presents with  . Foot Pain    Matthew [name] is a 33 y.o. male with no significant PMH reports he missed a step and twisted his left ankle.    Home Medications Prior to Admission medications   Medication Sig Start Date End Date Taking? Authorizing Provider  cyclobenzaprine  (FLEXERIL ) 10 MG tablet Take 1 tablet (10 mg total) by mouth 3 (three) times daily as needed for muscle spasms. DO NOT DRINK ALCOHOL OR DRIVE WHILE TAKING THIS MEDICATION 09/15/20   Stuart Vernell Norris, PA-C  EPINEPHrine  0.3 mg/0.3 mL IJ SOAJ injection Inject 0.3 mg into the muscle as needed for anaphylaxis. 01/08/21   Defelice, Jeanette, NP  famotidine  (PEPCID ) 20 MG tablet Take 1 tablet (20 mg total) by mouth 2 (two) times daily. 01/08/21   Defelice, Jeanette, NP  ketoconazole  (NIZORAL ) 2 % shampoo Apply 1 application topically 2 (two) times a week. 12/04/17   Stallings, Zoe A, MD  naproxen  (NAPROSYN ) 500 MG tablet Take 1 tablet (500 mg total) by mouth 2 (two) times daily as needed. 09/15/20   Stuart Vernell Norris, PA-C  predniSONE  (STERAPRED UNI-PAK 21 TAB) 10 MG (21) TBPK tablet Take by mouth daily. Take 6 tabs by mouth daily  for 2 days, then 5 tabs for 2 days, then 4 tabs for 2 days, then 3 tabs for 2 days, 2 tabs for 2 days, then 1 tab by mouth daily for 2 days 02/16/21   Leotis, Hina, PA-C  terbinafine  (LAMISIL ) 250 MG tablet Take 1 tablet (250 mg total) by mouth daily. 12/02/17   Bettie Santana LABOR, MD     Allergies    Patient has no known allergies.   Review of Systems   Review of Systems Please see HPI for pertinent positives and negatives  Physical Exam BP (!) 177/102   Pulse 74   Temp 98.3 F (36.8 C) (Temporal)   Resp 17   Ht 5' 10 (1.778 m)   Wt 99.8 kg   SpO2 100%   BMI 31.57 kg/m   Physical Exam  ED Results / Procedures / Treatments    EKG None  Procedures Procedures  Medications Ordered in the ED Medications - No data to display  Initial Impression and Plan  ***  ED Course       MDM Rules/Calculators/A&P Medical Decision Making Amount and/or Complexity of Data Reviewed Radiology: ordered.     Final Clinical Impression(s) / ED Diagnoses Final diagnoses:  None    Rx / DC Orders ED Discharge Orders     None

## 2024-06-16 NOTE — ED Provider Notes (Signed)
   [place] EMERGENCY DEPARTMENT AT [facility]  Provider Note  CSN: [id number] Arrival date & time: 06/15/24 2300  History Chief Complaint  Patient presents with   Foot Pain    Ontario Pettengill is a 33 y.o. male with no significant PMH reports he missed a step and twisted his left ankle. Complaining of worsening pain and swelling across his dorsal and lateral L foot. Worse with movement. Took Motrin 800mg  prior to arrival.    Home Medications Prior to Admission medications   Medication Sig Start Date End Date Taking? Authorizing Provider  EPINEPHrine  0.3 mg/0.3 mL IJ SOAJ injection Inject 0.3 mg into the muscle as needed for anaphylaxis. 01/08/21   Defelice, Jeanette, NP  famotidine  (PEPCID ) 20 MG tablet Take 1 tablet (20 mg total) by mouth 2 (two) times daily. 01/08/21   Defelice, Jeanette, NP  naproxen  (NAPROSYN ) 500 MG tablet Take 1 tablet (500 mg total) by mouth 2 (two) times daily as needed. 06/16/24   Roselyn Carlin NOVAK, MD     Allergies    Patient has no known allergies.   Review of Systems   Review of Systems Please see HPI for pertinent positives and negatives  Physical Exam BP (!) 177/102   Pulse 74   Temp 98.3 F (36.8 C) (Temporal)   Resp 17   Ht 5' 10 (1.778 m)   Wt 99.8 kg   SpO2 100%   BMI 31.57 kg/m   Physical Exam Vitals and nursing note reviewed.  HENT:     Head: Normocephalic.     Nose: Nose normal.  Eyes:     Extraocular Movements: Extraocular movements intact.  Pulmonary:     Effort: Pulmonary effort is normal.  Musculoskeletal:        General: Swelling (dorsal L foot) and tenderness (dorsal L foot and prox 5th metatarsal) present. Normal range of motion.     Cervical back: Neck supple.  Skin:    Findings: No rash (on exposed skin).  Neurological:     Mental Status: He is alert and oriented to person, place, and time.  Psychiatric:        Mood and Affect: Mood normal.     ED Results / Procedures / Treatments    EKG None  Procedures Procedures  Medications Ordered in the ED Medications - No data to display  Initial Impression and Plan  Patient here with L foot/ankle injury. I personally viewed the images from radiology studies and agree with radiologist interpretation: Xrays neg for fracture. Will place in ASO, crutches, ice, weight bearing as tolerated. PCP follow up, RTED for any other concerns.    ED Course       MDM Rules/Calculators/A&P Medical Decision Making Amount and/or Complexity of Data Reviewed Radiology: ordered and independent interpretation performed. Decision-making details documented in ED Course.  Risk Prescription drug management.     Final Clinical Impression(s) / ED Diagnoses Final diagnoses:  Sprain of left ankle, unspecified ligament, initial encounter    Rx / DC Orders ED Discharge Orders          Ordered    naproxen  (NAPROSYN ) 500 MG tablet  2 times daily PRN        06/16/24 0006             Roselyn Carlin NOVAK, MD 06/16/24 [id number]
